# Patient Record
Sex: Female | Born: 1995 | Race: White | Hispanic: No | Marital: Single | Smoking: Never smoker
Health system: Southern US, Community
[De-identification: ages and names within clinical notes are randomized; demographics above are authoritative.]

## PROBLEM LIST (undated history)

## (undated) DIAGNOSIS — Z862 Personal history of diseases of the blood and blood-forming organs and certain disorders involving the immune mechanism: Secondary | ICD-10-CM

---

## 2017-12-15 ENCOUNTER — Encounter (HOSPITAL_BASED_OUTPATIENT_CLINIC_OR_DEPARTMENT_OTHER): Payer: Self-pay | Admitting: *Deleted

## 2017-12-15 ENCOUNTER — Other Ambulatory Visit: Payer: Self-pay

## 2017-12-15 ENCOUNTER — Emergency Department (HOSPITAL_BASED_OUTPATIENT_CLINIC_OR_DEPARTMENT_OTHER): Payer: 59

## 2017-12-15 ENCOUNTER — Emergency Department (HOSPITAL_BASED_OUTPATIENT_CLINIC_OR_DEPARTMENT_OTHER)
Admission: EM | Admit: 2017-12-15 | Discharge: 2017-12-15 | Disposition: A | Discharge status: Home / Self Care | Payer: 59 | Attending: Emergency Medicine | Admitting: Emergency Medicine

## 2017-12-15 DIAGNOSIS — R1013 Epigastric pain: Secondary | ICD-10-CM | POA: Diagnosis present

## 2017-12-15 DIAGNOSIS — R1031 Right lower quadrant pain: Secondary | ICD-10-CM

## 2017-12-15 DIAGNOSIS — K529 Noninfective gastroenteritis and colitis, unspecified: Secondary | ICD-10-CM | POA: Diagnosis not present

## 2017-12-15 HISTORY — DX: Personal history of diseases of the blood and blood-forming organs and certain disorders involving the immune mechanism: Z86.2

## 2017-12-15 LAB — URINALYSIS, ROUTINE W REFLEX MICROSCOPIC
BILIRUBIN URINE: NEGATIVE
GLUCOSE, UA: NEGATIVE mg/dL
KETONES UR: NEGATIVE mg/dL
Leukocytes, UA: NEGATIVE
Nitrite: NEGATIVE
PH: 6.5 (ref 5.0–8.0)
PROTEIN: NEGATIVE mg/dL
Specific Gravity, Urine: 1.01 (ref 1.005–1.030)

## 2017-12-15 LAB — CBC WITH DIFFERENTIAL/PLATELET
BASOS ABS: 0 10*3/uL (ref 0.0–0.1)
BASOS PCT: 0 %
EOS PCT: 0 %
Eosinophils Absolute: 0 10*3/uL (ref 0.0–0.7)
HEMATOCRIT: 43.6 % (ref 36.0–46.0)
Hemoglobin: 15.2 g/dL — ABNORMAL HIGH (ref 12.0–15.0)
Lymphocytes Relative: 8 %
Lymphs Abs: 0.8 10*3/uL (ref 0.7–4.0)
MCH: 29.2 pg (ref 26.0–34.0)
MCHC: 34.9 g/dL (ref 30.0–36.0)
MCV: 83.7 fL (ref 78.0–100.0)
MONOS PCT: 7 %
Monocytes Absolute: 0.6 10*3/uL (ref 0.1–1.0)
NEUTROS ABS: 8.4 10*3/uL — AB (ref 1.7–7.7)
Neutrophils Relative %: 85 %
PLATELETS: 160 10*3/uL (ref 150–400)
RBC: 5.21 MIL/uL — ABNORMAL HIGH (ref 3.87–5.11)
RDW: 13.2 % (ref 11.5–15.5)
WBC: 9.9 10*3/uL (ref 4.0–10.5)

## 2017-12-15 LAB — COMPREHENSIVE METABOLIC PANEL
ALBUMIN: 4.3 g/dL (ref 3.5–5.0)
ALK PHOS: 40 U/L (ref 38–126)
ALT: 22 U/L (ref 14–54)
AST: 25 U/L (ref 15–41)
Anion gap: 8 (ref 5–15)
BILIRUBIN TOTAL: 0.6 mg/dL (ref 0.3–1.2)
BUN: 14 mg/dL (ref 6–20)
CO2: 22 mmol/L (ref 22–32)
CREATININE: 0.7 mg/dL (ref 0.44–1.00)
Calcium: 9 mg/dL (ref 8.9–10.3)
Chloride: 107 mmol/L (ref 101–111)
GFR calc Af Amer: >60 mL/min (ref >60)
GLUCOSE: 95 mg/dL (ref 65–99)
Potassium: 4.3 mmol/L (ref 3.5–5.1)
Sodium: 137 mmol/L (ref 135–145)
TOTAL PROTEIN: 7.5 g/dL (ref 6.5–8.1)

## 2017-12-15 LAB — LIPASE, BLOOD: LIPASE: 22 U/L (ref 11–51)

## 2017-12-15 LAB — URINALYSIS, MICROSCOPIC (REFLEX)

## 2017-12-15 LAB — PREGNANCY, URINE: PREG TEST UR: NEGATIVE

## 2017-12-15 MED ORDER — SODIUM CHLORIDE 0.9 % IV BOLUS: 1000.0000 mL | Freq: Once | INTRAVENOUS | Status: DC

## 2017-12-15 MED ORDER — SODIUM CHLORIDE 0.9 % IV SOLN
INTRAVENOUS | Status: DC
  Administered 2017-12-15: 09:00:00 via INTRAVENOUS

## 2017-12-15 MED ORDER — PANTOPRAZOLE SODIUM 40 MG IV SOLR
40.0000 mg | Freq: Once | INTRAVENOUS | Status: AC
  Administered 2017-12-15: 40 mg via INTRAVENOUS
  Filled 2017-12-15: qty 40

## 2017-12-15 MED ORDER — TRAMADOL HCL 50 MG PO TABS: ORAL_TABLET | ORAL | 0 refills | Status: AC

## 2017-12-15 MED ORDER — IOPAMIDOL (ISOVUE-300) INJECTION 61%
100.0000 mL | Freq: Once | INTRAVENOUS | Status: AC | PRN
  Administered 2017-12-15: 100 mL via INTRAVENOUS

## 2017-12-15 MED ORDER — PANTOPRAZOLE SODIUM 20 MG PO TBEC: 20.0000 mg | DELAYED_RELEASE_TABLET | Freq: Every day | ORAL | 0 refills | Status: AC

## 2017-12-15 NOTE — ED Triage Notes (Signed)
Pt reports sudden onset of intermittent epigastric pain that shoots down to right lq x last night. This morning she was pain free so she went to work, then pain returned. One episode of emesis this am "because the pain was so bad.Marland Kitchen.Marland Kitchen."

## 2017-12-15 NOTE — ED Provider Notes (Signed)
[facility] EMERGENCY DEPARTMENT Provider Note   CSN: [id number] Arrival date & time: 12/15/17  0757     History   Chief Complaint Chief Complaint  Patient presents with  . Abdominal Pain    HPI Dana Baker is a 22 y.o. female.  HPI Patient ate a meal at home last night.  She prepared vegan pasta with cashew sauce and spinach and butternut squash.  After she ate she felt a little uncomfortable and thought maybe it was food related.  She got up and went to work this morning and abdominal pain got much worse.  Reports she started developing pain seemed to be in her epigastrium and radiated down towards her right side.  This was happening repetitively and it was severe.  He denies ever having similar pain.  She reports it made her very nauseated.  No fever, no chills.  No pain burning urgency with urination.  No history of similar pain.  No prior abdominal surgeries. Past Medical History:  Diagnosis Date  . History of ITP     There are no active problems to display for this patient.   History reviewed. No pertinent surgical history.   OB History   None      Home Medications    Prior to Admission medications   Medication Sig Start Date End Date Taking? Authorizing Provider  pantoprazole (PROTONIX) 20 MG tablet Take 1 tablet (20 mg total) by mouth daily. 12/15/17   Arby Barrette, MD  traMADol (ULTRAM) 50 MG tablet 1-2 tablets every 6 hours as needed for pain. 12/15/17   Arby Barrette, MD    Family History History reviewed. No pertinent family history.  Social History Social History   Tobacco Use  . Smoking status: Never Smoker  . Smokeless tobacco: Never Used  Substance Use Topics  . Alcohol use: Not on file  . Drug use: Not on file     Allergies   Patient has no known allergies.   Review of Systems Review of Systems 10 Systems reviewed and are negative for acute change except as noted in the HPI.   Physical Exam Updated Vital Signs BP  106/74 (BP Location: Right Arm)   Pulse 77   Temp 98 F (36.7 C) (Oral)   Resp 18   Ht 5\' 4"  (1.626 m)   Wt 54.4 kg (120 lb)   LMP 12/07/2017 Comment: negative u preg today  SpO2 98%   BMI 20.60 kg/m   Physical Exam  Constitutional: She is oriented to person, place, and time. She appears well-developed and well-nourished.  HENT:  Head: Normocephalic and atraumatic.  Eyes: Pupils are equal, round, and reactive to light. EOM are normal.  Neck: Neck supple.  Cardiovascular: Normal rate, regular rhythm, normal heart sounds and intact distal pulses.  Pulmonary/Chest: Effort normal and breath sounds normal.  Abdominal: Soft. Bowel sounds are normal. She exhibits no distension. There is tenderness.  The gastrium and right upper quadrant tender to palpation.  No guarding.  No psoas sign.  Musculoskeletal: Normal range of motion. She exhibits no edema.  Neurological: She is alert and oriented to person, place, and time. She has normal strength. She exhibits normal muscle tone. Coordination normal. GCS eye subscore is 4. GCS verbal subscore is 5. GCS motor subscore is 6.  Skin: Skin is warm, dry and intact.  Psychiatric: She has a normal mood and affect.     ED Treatments / Results  Labs (all labs ordered are listed, but only abnormal  results are displayed) Labs Reviewed  URINALYSIS, ROUTINE W REFLEX MICROSCOPIC - Abnormal; Notable for the following components:      Result Value   Hgb urine dipstick SMALL (*)    All other components within normal limits  URINALYSIS, MICROSCOPIC (REFLEX) - Abnormal; Notable for the following components:   Bacteria, UA FEW (*)    Squamous Epithelial / LPF 0-5 (*)    All other components within normal limits  CBC WITH DIFFERENTIAL/PLATELET - Abnormal; Notable for the following components:   RBC 5.21 (*)    Hemoglobin 15.2 (*)    Neutro Abs 8.4 (*)    All other components within normal limits  PREGNANCY, URINE  COMPREHENSIVE METABOLIC PANEL  LIPASE,  BLOOD    EKG None  Radiology Ct Abdomen Pelvis W Contrast  Result Date: 12/15/2017 CLINICAL DATA:  Acute onset abdominal pain EXAM: CT ABDOMEN AND PELVIS WITH CONTRAST TECHNIQUE: Multidetector CT imaging of the abdomen and pelvis was performed using the standard protocol following bolus administration of intravenous contrast. CONTRAST:  100mL ISOVUE-300 IOPAMIDOL (ISOVUE-300) INJECTION 61% COMPARISON:  Ultrasound right upper quadrant December 15, 2017 FINDINGS: Lower chest: Lung bases are clear. Hepatobiliary: No focal liver lesions are evident on this study. Gallbladder wall is not appreciably thickened. There is no biliary duct dilatation. Pancreas: No pancreatic mass or inflammatory focus. Spleen: No splenic lesions are evident. Adrenals/Urinary Tract: Adrenals appear normal bilaterally. Kidneys bilaterally show no evident mass or hydronephrosis on either side. There is no renal or ureteral calculus on either side. Urinary bladder is midline with wall thickness within normal limits. Stomach/Bowel: There are sigmoid diverticula without diverticulitis. There is slight bowel wall thickening involving loops of jejunum in the left abdomen. There is no appreciable bowel dilatation or transition zone. No bowel obstruction. No free air or portal venous air. Vascular/Lymphatic: No abdominal aortic aneurysm. No vascular lesions are evident. There is no adenopathy in the abdomen or pelvis. Reproductive: Uterus is anteverted.  No evident pelvic mass. Other: Appendix appears normal. No abscess or ascites evident in the abdomen or pelvis. Musculoskeletal: No blastic or lytic bone lesions. No intramuscular or abdominal wall lesions are evident. IMPRESSION: 1. Slight wall thickening in several loops of jejunum. Suspect a degree of enteritis. No bowel obstruction. 2.  There are sigmoid diverticula without diverticulitis. 3.  No abscess.  Appendix appears normal. 4.  No renal or ureteral calculi.  No hydronephrosis.  Electronically Signed   By: Bretta BangWilliam  Woodruff III M.D.   On: 12/15/2017 11:46   [place] Abdomen Limited  Result Date: 12/15/2017 CLINICAL DATA:  Right upper quadrant pain since last night after eating, some nausea and vomiting as well. EXAM: ULTRASOUND ABDOMEN LIMITED RIGHT UPPER QUADRANT COMPARISON:  None. FINDINGS: Gallbladder: The gallbladder is well visualized and no gallstones are noted. There is no pain over the gallbladder with compression. Common bile duct: Diameter: The common bile duct is normal measuring 2.4 mm in diameter. Liver: The parenchyma of the liver is normal in echogenicity. No focal hepatic abnormality is seen. Portal vein is patent on color Doppler imaging with normal direction of blood flow towards the liver. IMPRESSION: Negative limited ultrasound of the right upper quadrant. Electronically Signed   By: Dwyane DeePaul  Barry M.D.   On: 12/15/2017 09:48    Procedures Procedures (including critical care time)  Medications Ordered in ED Medications  0.9 %  sodium chloride infusion ( Intravenous New Bag/Given 12/15/17 0921)  sodium chloride 0.9 % bolus 1,000 mL (has no administration in time range)  pantoprazole (PROTONIX) injection 40 mg (40 mg Intravenous Given 12/15/17 0921)  iopamidol (ISOVUE-300) 61 % injection 100 mL (100 mLs Intravenous Contrast Given 12/15/17 1132)     Initial Impression / Assessment and Plan / ED Course  I have reviewed the triage vital signs and the nursing notes.  Pertinent labs & imaging results that were available during my care of the patient were reviewed by me and considered in my medical decision making (see chart for details).     10: 50 ultrasound negative.  Patient continues to have pain in the epigastrium and right lower quadrant.  She reports is better with remaining still and worse with walking.  Proceed with CT scan to rule out appendicitis.  Final Clinical Impressions(s) / ED Diagnoses   Final diagnoses:  Right lower quadrant abdominal pain    Enteritis  Presented with abdominal pain which started gradually and mild last night.  Become much more severe today.  CT does not show any evidence of appendicitis.  Some diffuse thickening of the jejunum identified.  This could be consistent with a viral or food type enteritis.  Patient does not have prior history that was suggest she has Crohn's disease.  She does not have problems with recurrent pain, diarrhea or vomiting.  Labs are normal.  Patient is clinically well.  At this time I do not feel that empiric antibiotics are indicated.  Plan will be symptom medic treatment with tramadol, continued fluids intake and Prilosec.  She has a scheduled close follow-up with her PCP.  Return precautions are reviewed.  Patient is also counseled of possible need for subsequent endoscopy.  She is also given return precautions for fever, worsening pain, bloody diarrhea or other concerning symptoms.  ED Discharge Orders        Ordered    pantoprazole (PROTONIX) 20 MG tablet  Daily     12/15/17 1158    traMADol (ULTRAM) 50 MG tablet     12/15/17 1158       Arby Barrette, MD 12/15/17 1202

## 2017-12-15 NOTE — ED Notes (Signed)
NAD at this time. Pt is stable and going home.  

## 2017-12-15 NOTE — Discharge Instructions (Signed)
1.  CT scan shows some inflammation of the small intestine.  This is frequently caused by a virus.  Sometimes however, this can be a sign of bacterial infection or Crohn's disease.  It is very important that you schedule a follow-up appointment with your family doctor.  You may need referral for an endoscopy. 2.  Continue to take small amounts of fluids frequently.  Take Prilosec for the next week.  Take tramadol 1-2 tablets every 6 hours as needed for pain. 3.  Return to the emergency department if you develop fever, worsening pain, bloody diarrhea or other concerning symptoms.

## 2018-09-22 IMAGING — US US ABDOMEN LIMITED
1 series · 14 of 25 positions shown · non-contrast
Comparison: None.

CLINICAL DATA: Right upper quadrant pain since last night after
eating, some nausea and vomiting as well.

EXAM:
ULTRASOUND ABDOMEN LIMITED RIGHT UPPER QUADRANT

[Series 1: us abdomen limited · 0.18mm/px · 14 of 64 slices shown]
[im 1/64]
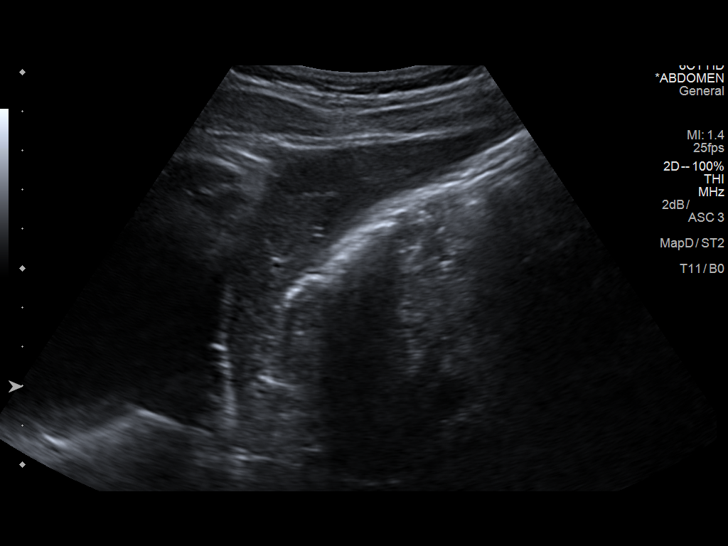
[im 6/64]
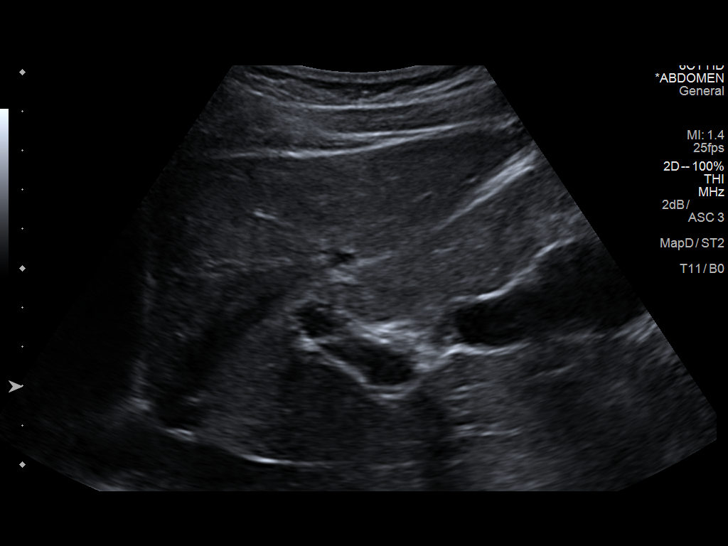
[im 11/64]
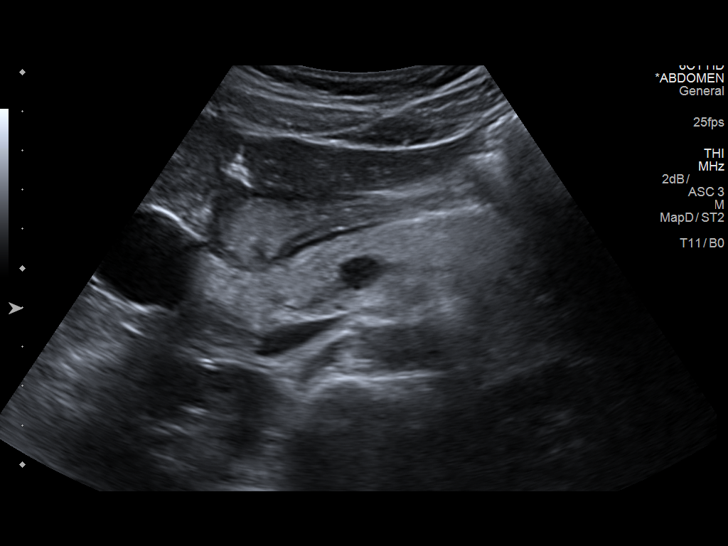
[im 16/64]
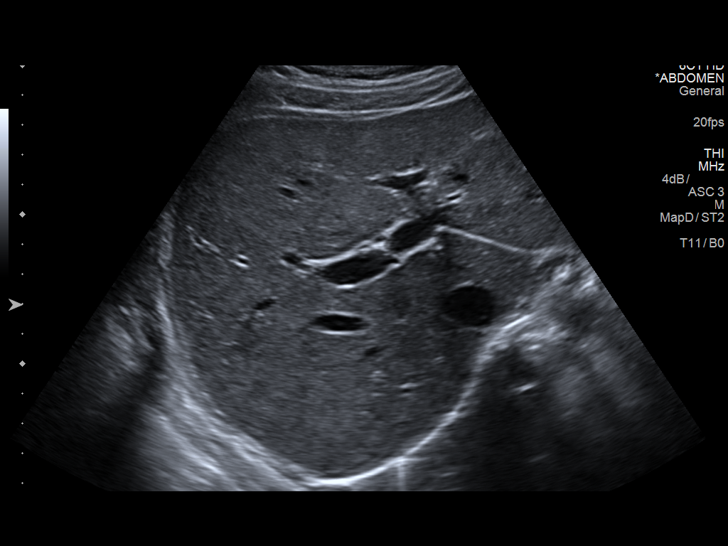
[im 22/64]
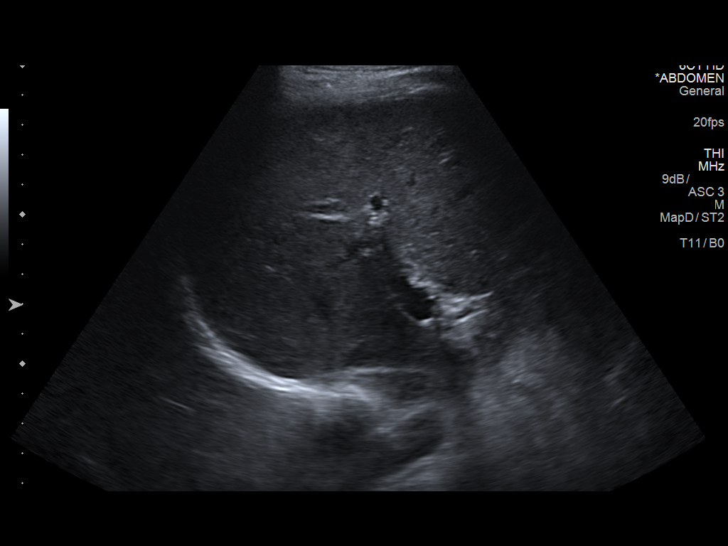
[im 24/64]
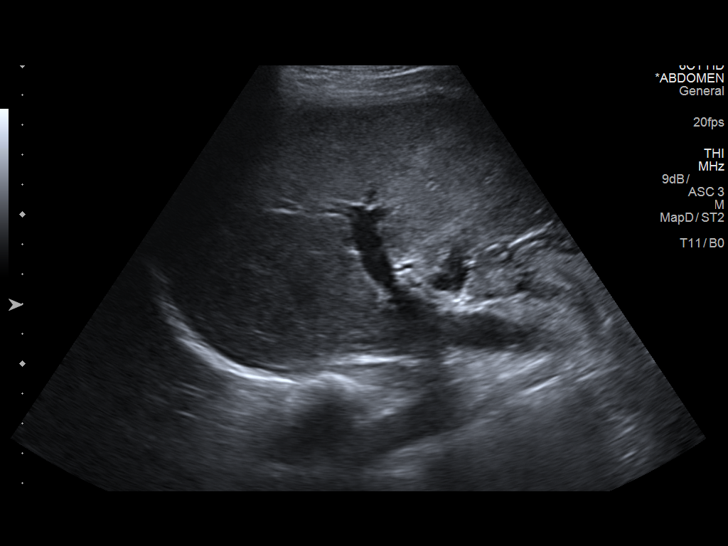
[im 29/64]
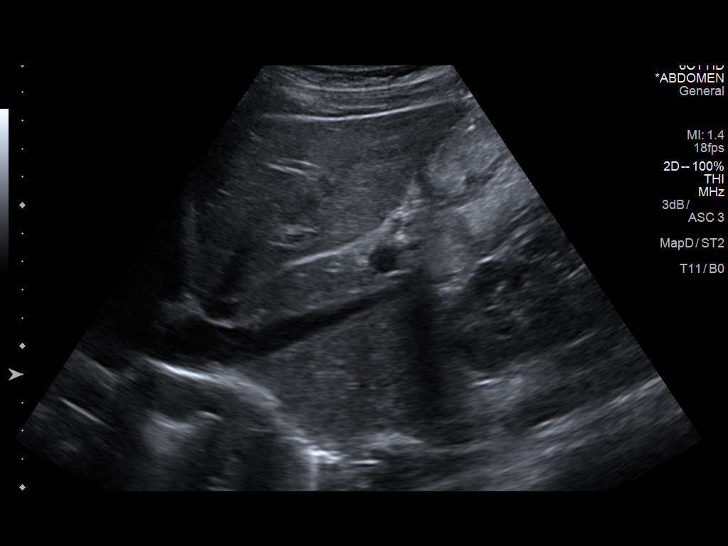
[im 35/64]
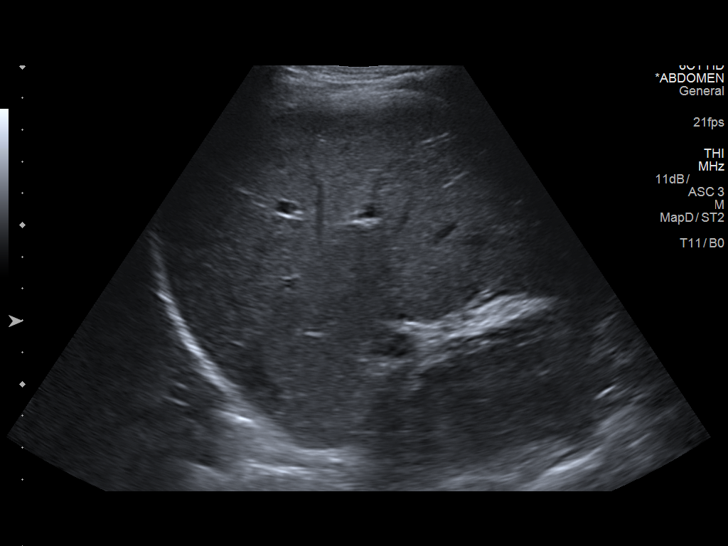
[im 40/64]
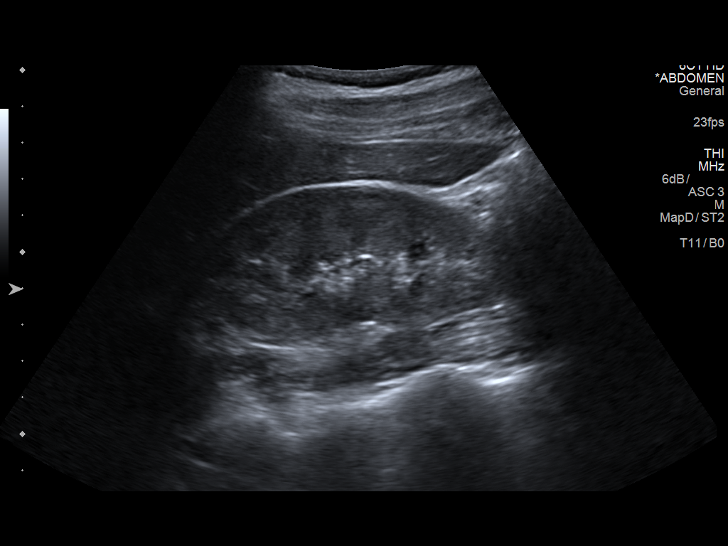
[im 43/64]
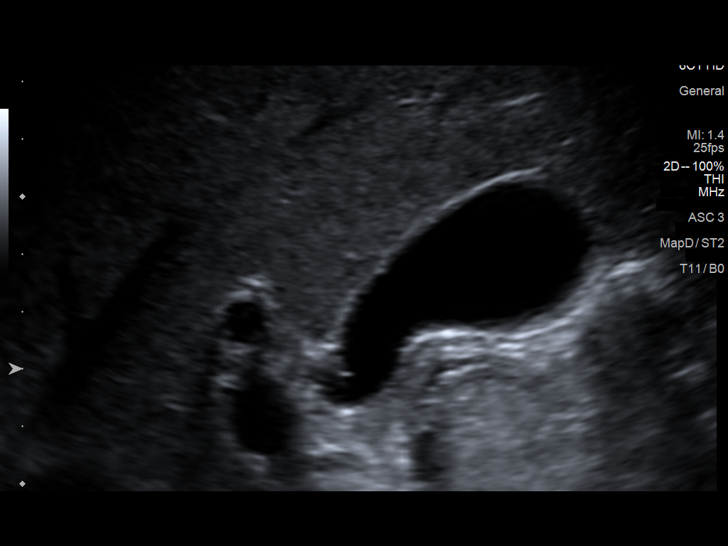
[im 48/64]
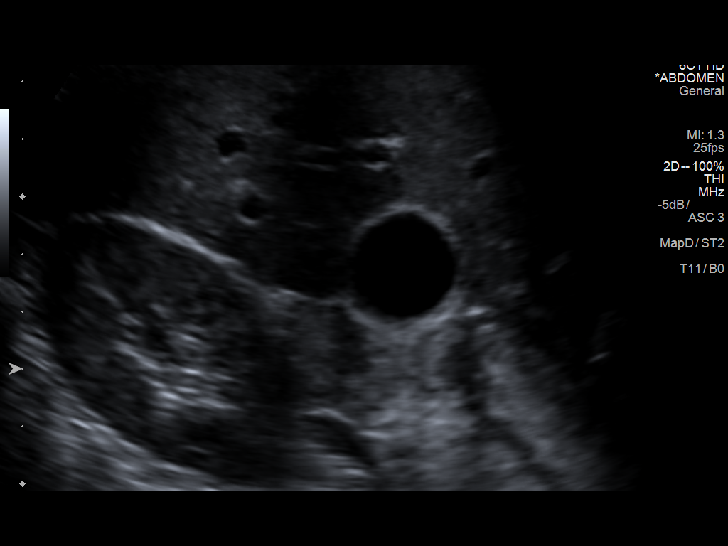
[im 53/64]
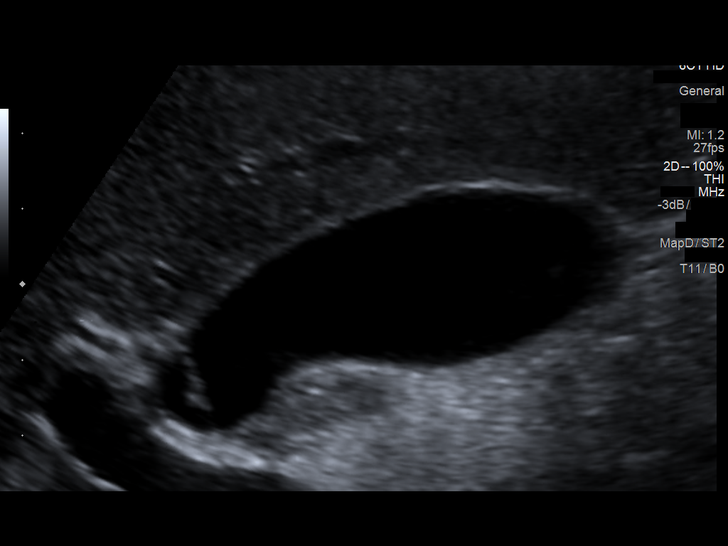
[im 58/64]
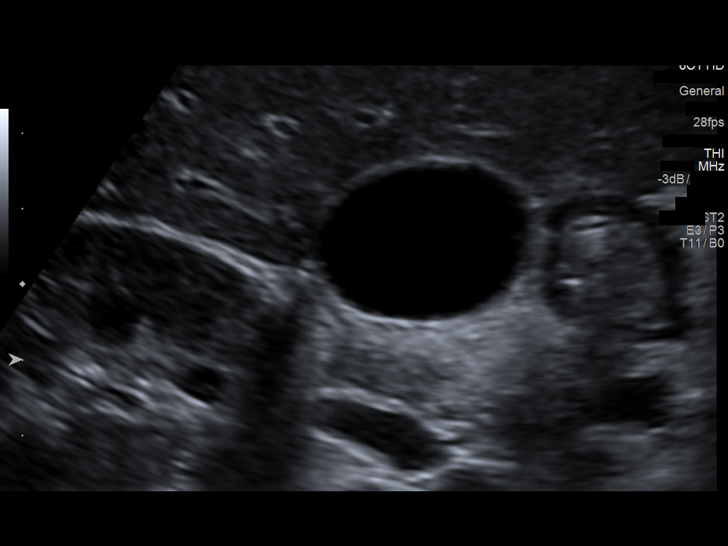
[im 64/64]
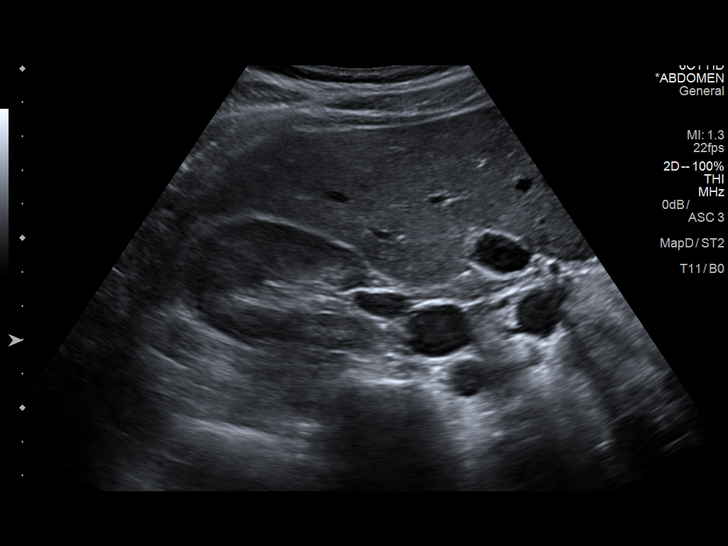

[14 of 25 positions shown; findings below may reference images not displayed]

FINDINGS: Gallbladder:

The gallbladder is well visualized and no gallstones are noted.
There is no pain over the gallbladder with compression.

Common bile duct:

Diameter: The common bile duct is normal measuring 2.4 mm in
diameter.

Liver:

The parenchyma of the liver is normal in echogenicity. No focal
hepatic abnormality is seen. Portal vein is patent on color Doppler
imaging with normal direction of blood flow towards the liver.
IMPRESSION: Negative limited ultrasound of the right upper quadrant.
